# Patient Record
Sex: Female | Born: 1950 | Race: White | Hispanic: No | Marital: Married | Smoking: Former smoker
Health system: Southern US, Community
[De-identification: ages and names within clinical notes are randomized; demographics above are authoritative.]

---

## 2012-03-20 DIAGNOSIS — K5000 Crohn's disease of small intestine without complications: Secondary | ICD-10-CM | POA: Insufficient documentation

## 2012-03-20 DIAGNOSIS — D509 Iron deficiency anemia, unspecified: Secondary | ICD-10-CM | POA: Insufficient documentation

## 2012-03-20 DIAGNOSIS — Z7960 Long term (current) use of unspecified immunomodulators and immunosuppressants: Secondary | ICD-10-CM | POA: Insufficient documentation

## 2012-05-24 ENCOUNTER — Ambulatory Visit: Payer: Self-pay | Admitting: Family Medicine

## 2012-08-15 DIAGNOSIS — K56600 Partial intestinal obstruction, unspecified as to cause: Secondary | ICD-10-CM | POA: Insufficient documentation

## 2012-11-02 DIAGNOSIS — K9089 Other intestinal malabsorption: Secondary | ICD-10-CM | POA: Insufficient documentation

## 2013-10-18 ENCOUNTER — Ambulatory Visit: Payer: Self-pay | Admitting: Family Medicine

## 2014-12-16 DIAGNOSIS — G4700 Insomnia, unspecified: Secondary | ICD-10-CM | POA: Insufficient documentation

## 2015-06-18 ENCOUNTER — Other Ambulatory Visit: Payer: Self-pay | Admitting: Family Medicine

## 2015-06-18 DIAGNOSIS — Z1231 Encounter for screening mammogram for malignant neoplasm of breast: Secondary | ICD-10-CM

## 2015-06-19 ENCOUNTER — Ambulatory Visit
Admission: RE | Admit: 2015-06-19 | Discharge: 2015-06-19 | Disposition: A | Discharge status: Home / Self Care | Payer: BLUE CROSS/BLUE SHIELD | Source: Ambulatory Visit | Attending: Family Medicine | Admitting: Family Medicine

## 2015-06-19 DIAGNOSIS — Z1231 Encounter for screening mammogram for malignant neoplasm of breast: Secondary | ICD-10-CM | POA: Diagnosis not present

## 2016-05-21 ENCOUNTER — Other Ambulatory Visit: Payer: Self-pay | Admitting: Family Medicine

## 2016-05-21 DIAGNOSIS — Z1231 Encounter for screening mammogram for malignant neoplasm of breast: Secondary | ICD-10-CM

## 2016-06-21 ENCOUNTER — Ambulatory Visit
Admission: RE | Admit: 2016-06-21 | Discharge: 2016-06-21 | Disposition: A | Discharge status: Home / Self Care | Payer: Medicare HMO | Source: Ambulatory Visit | Attending: Family Medicine | Admitting: Family Medicine

## 2016-06-21 DIAGNOSIS — Z1231 Encounter for screening mammogram for malignant neoplasm of breast: Secondary | ICD-10-CM | POA: Insufficient documentation

## 2017-04-05 DIAGNOSIS — E781 Pure hyperglyceridemia: Secondary | ICD-10-CM | POA: Insufficient documentation

## 2017-05-24 ENCOUNTER — Other Ambulatory Visit: Payer: Self-pay | Admitting: Family Medicine

## 2017-05-24 DIAGNOSIS — Z1231 Encounter for screening mammogram for malignant neoplasm of breast: Secondary | ICD-10-CM

## 2017-06-23 ENCOUNTER — Ambulatory Visit
Admission: RE | Admit: 2017-06-23 | Discharge: 2017-06-23 | Disposition: A | Discharge status: Home / Self Care | Payer: Medicare HMO | Source: Ambulatory Visit | Attending: Family Medicine | Admitting: Family Medicine

## 2017-06-23 ENCOUNTER — Encounter (INDEPENDENT_AMBULATORY_CARE_PROVIDER_SITE_OTHER): Payer: Self-pay

## 2017-06-23 DIAGNOSIS — Z1231 Encounter for screening mammogram for malignant neoplasm of breast: Secondary | ICD-10-CM | POA: Diagnosis not present

## 2017-07-13 DIAGNOSIS — M47816 Spondylosis without myelopathy or radiculopathy, lumbar region: Secondary | ICD-10-CM | POA: Insufficient documentation

## 2017-07-13 DIAGNOSIS — G8929 Other chronic pain: Secondary | ICD-10-CM | POA: Insufficient documentation

## 2017-08-24 DIAGNOSIS — M7072 Other bursitis of hip, left hip: Secondary | ICD-10-CM | POA: Insufficient documentation

## 2017-09-06 DIAGNOSIS — M810 Age-related osteoporosis without current pathological fracture: Secondary | ICD-10-CM | POA: Insufficient documentation

## 2018-01-06 DIAGNOSIS — H4089 Other specified glaucoma: Secondary | ICD-10-CM | POA: Insufficient documentation

## 2018-04-01 DIAGNOSIS — R1319 Other dysphagia: Secondary | ICD-10-CM | POA: Insufficient documentation

## 2018-04-24 ENCOUNTER — Other Ambulatory Visit: Payer: Self-pay | Admitting: Family Medicine

## 2018-04-24 DIAGNOSIS — Z1231 Encounter for screening mammogram for malignant neoplasm of breast: Secondary | ICD-10-CM

## 2018-06-27 ENCOUNTER — Ambulatory Visit
Admission: RE | Admit: 2018-06-27 | Discharge: 2018-06-27 | Disposition: A | Discharge status: Home / Self Care | Payer: Medicare HMO | Source: Ambulatory Visit | Attending: Family Medicine | Admitting: Family Medicine

## 2018-06-27 DIAGNOSIS — Z1231 Encounter for screening mammogram for malignant neoplasm of breast: Secondary | ICD-10-CM

## 2018-07-12 DIAGNOSIS — G5702 Lesion of sciatic nerve, left lower limb: Secondary | ICD-10-CM | POA: Insufficient documentation

## 2019-02-08 DIAGNOSIS — R200 Anesthesia of skin: Secondary | ICD-10-CM | POA: Insufficient documentation

## 2019-02-08 DIAGNOSIS — R202 Paresthesia of skin: Secondary | ICD-10-CM | POA: Insufficient documentation

## 2019-03-09 DIAGNOSIS — R200 Anesthesia of skin: Secondary | ICD-10-CM | POA: Insufficient documentation

## 2019-03-09 DIAGNOSIS — M79671 Pain in right foot: Secondary | ICD-10-CM | POA: Insufficient documentation

## 2019-03-09 DIAGNOSIS — M436 Torticollis: Secondary | ICD-10-CM | POA: Insufficient documentation

## 2019-03-15 ENCOUNTER — Other Ambulatory Visit: Payer: Self-pay | Admitting: Neurology

## 2019-03-15 DIAGNOSIS — R200 Anesthesia of skin: Secondary | ICD-10-CM

## 2019-03-23 ENCOUNTER — Ambulatory Visit
Admission: RE | Admit: 2019-03-23 | Discharge: 2019-03-23 | Disposition: A | Discharge status: Home / Self Care | Payer: Medicare HMO | Source: Ambulatory Visit | Attending: Neurology | Admitting: Neurology

## 2019-03-23 ENCOUNTER — Other Ambulatory Visit: Payer: Self-pay

## 2019-03-23 DIAGNOSIS — R202 Paresthesia of skin: Secondary | ICD-10-CM | POA: Diagnosis present

## 2019-03-23 DIAGNOSIS — R200 Anesthesia of skin: Secondary | ICD-10-CM

## 2019-03-30 ENCOUNTER — Other Ambulatory Visit (INDEPENDENT_AMBULATORY_CARE_PROVIDER_SITE_OTHER): Payer: Self-pay | Admitting: Vascular Surgery

## 2019-03-30 DIAGNOSIS — R200 Anesthesia of skin: Secondary | ICD-10-CM

## 2019-04-09 ENCOUNTER — Ambulatory Visit (INDEPENDENT_AMBULATORY_CARE_PROVIDER_SITE_OTHER): Payer: Medicare HMO

## 2019-04-09 ENCOUNTER — Encounter (INDEPENDENT_AMBULATORY_CARE_PROVIDER_SITE_OTHER): Payer: Self-pay | Admitting: Vascular Surgery

## 2019-04-09 ENCOUNTER — Other Ambulatory Visit: Payer: Self-pay

## 2019-04-09 ENCOUNTER — Ambulatory Visit (INDEPENDENT_AMBULATORY_CARE_PROVIDER_SITE_OTHER): Payer: Medicare HMO | Admitting: Vascular Surgery

## 2019-04-09 VITALS — BP 122/72 | HR 69 | Resp 16 | Ht 66.0 in | Wt 148.0 lb

## 2019-04-09 DIAGNOSIS — G4730 Sleep apnea, unspecified: Secondary | ICD-10-CM | POA: Insufficient documentation

## 2019-04-09 DIAGNOSIS — R202 Paresthesia of skin: Secondary | ICD-10-CM

## 2019-04-09 DIAGNOSIS — R200 Anesthesia of skin: Secondary | ICD-10-CM

## 2019-04-09 DIAGNOSIS — G8929 Other chronic pain: Secondary | ICD-10-CM

## 2019-04-09 DIAGNOSIS — M5442 Lumbago with sciatica, left side: Secondary | ICD-10-CM

## 2019-04-09 DIAGNOSIS — E781 Pure hyperglyceridemia: Secondary | ICD-10-CM

## 2019-04-09 DIAGNOSIS — T7840XA Allergy, unspecified, initial encounter: Secondary | ICD-10-CM | POA: Insufficient documentation

## 2019-04-09 DIAGNOSIS — I10 Essential (primary) hypertension: Secondary | ICD-10-CM | POA: Diagnosis not present

## 2019-04-09 NOTE — Progress Notes (Signed)
MRN : [id number]  Kaitlin Fisher is a 68 y.o. (02/15/1951) female who presents with chief complaint of  Chief Complaint  Patient presents with   New Patient (Initial Visit)    LE numbness  .  History of Present Illness:   The patient is seen for evaluation of painful lower extremities. Patient notes the pain is variable and not always associated with activity.  The pain is somewhat consistent day to day occurring on most days. The patient notes the pain also occurs with standing and routinely seems worse as the day wears on. The pain has been progressive over the past several years. The patient states these symptoms are causing  a profound negative impact on quality of life and daily activities.  The patient denies rest pain or dangling of an extremity off the side of the bed during the night for relief. No open wounds or sores at this time. No history of DVT or phlebitis. No prior interventions or surgeries.  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   ABI's are normal bilaterally   Current Meds  Medication Sig   alendronate (FOSAMAX) 70 MG tablet Take by mouth.   amitriptyline (ELAVIL) 25 MG tablet Take by mouth.   amLODipine (NORVASC) 10 MG tablet Take by mouth.   Cholecalciferol (VITAMIN D-1000 MAX ST) 25 MCG (1000 UT) tablet Take by mouth.   colestipol (COLESTID) 1 g tablet Take by mouth.   gabapentin (NEURONTIN) 100 MG capsule Tale 100 mg three times a day   latanoprost (XALATAN) 0.005 % ophthalmic solution INSTILL 1 DROP INTO EACH EYE IN THE EVENING FOR 30 DAYS   potassium chloride SA (KLOR-CON) 20 MEQ tablet Take by mouth.   ustekinumab (STELARA) 90 MG/ML SOSY injection Inject into the skin.    History reviewed. No pertinent past medical history.  History reviewed. No pertinent surgical history.  Social History Social History   Tobacco Use   Smoking status: Former Smoker   Smokeless tobacco: Never Used  Substance  Use Topics   Alcohol use: Not on file   Drug use: Not on file    Family History Family History  Problem Relation Age of Onset   Breast cancer Neg Hx   No family history of bleeding/clotting disorders, porphyria or autoimmune disease   Allergies  Allergen Reactions   Oxycodone-Acetaminophen Nausea Only     REVIEW OF SYSTEMS (Negative unless checked)  Constitutional: [] Weight loss  [] Fever  [] Chills Cardiac: [] Chest pain   [] Chest pressure   [] Palpitations   [] Shortness of breath when laying flat   [] Shortness of breath with exertion. Vascular:  [x] Pain in legs with walking   [x] Pain in legs at rest  [] History of DVT   [] Phlebitis   [] Swelling in legs   [] Varicose veins   [] Non-healing ulcers Pulmonary:   [] Uses home oxygen   [] Productive cough   [] Hemoptysis   [] Wheeze  [] COPD   [] Asthma Neurologic:  [] Dizziness   [] Seizures   [] History of stroke   [] History of TIA  [] Aphasia   [] Vissual changes   [] Weakness or numbness in arm   [] Weakness or numbness in leg Musculoskeletal:   [] Joint swelling   [] Joint pain   [] Low back pain Hematologic:  [] Easy bruising  [] Easy bleeding   [] Hypercoagulable state   [] Anemic Gastrointestinal:  [] Diarrhea   [] Vomiting  [] Gastroesophageal reflux/heartburn   [] Difficulty swallowing. Genitourinary:  [] Chronic kidney disease   [] Difficult urination  [] Frequent urination   []   Blood in urine Skin:  [] Rashes   [] Ulcers  Psychological:  [] History of anxiety   []  History of major depression.  Physical Examination  Vitals:   04/09/19 1532  BP: 122/72  Pulse: 69  Resp: 16  Weight: 148 lb (67.1 kg)  Height: 5\' 6"  (1.676 m)   Body mass index is 23.89 kg/m. Gen: WD/WN, NAD Head: [place]/AT, No temporalis wasting.  Ear/Nose/Throat: Hearing grossly intact, nares w/o erythema or drainage, poor dentition Eyes: PER, EOMI, sclera nonicteric.  Neck: Supple, no masses.  No bruit or JVD.  Pulmonary:  Good air movement, clear to auscultation bilaterally, no use  of accessory muscles.  Cardiac: RRR, normal S1, S2, no Murmurs. Vascular:  Vessel Right Left  Radial Palpable Palpable  PT Palpable Palpable  DP Palpable Palpable  Gastrointestinal: soft, non-distended. No guarding/no peritoneal signs.  Musculoskeletal: M/S 5/5 throughout.  No deformity or atrophy.  Neurologic: CN 2-12 intact. Pain and light touch intact in extremities.  Symmetrical.  Speech is fluent. Motor exam as listed above. Psychiatric: Judgment intact, Mood & affect appropriate for pt's clinical situation. Dermatologic: No rashes or ulcers noted.  No changes consistent with cellulitis. Lymph : No Cervical lymphadenopathy, no lichenification or skin changes of chronic lymphedema.  CBC No results found for: WBC, HGB, HCT, MCV, PLT  BMET No results found for: NA, K, CL, CO2, GLUCOSE, BUN, CREATININE, CALCIUM, GFRNONAA, GFRAA CrCl cannot be calculated (No successful lab value found.).  COAG No results found for: INR, PROTIME  Radiology Mr Lumbar Spine Wo Contrast  Result Date: 03/24/2019 CLINICAL DATA:  Left foot numbness. Tingling in both feet EXAM: MRI LUMBAR SPINE WITHOUT CONTRAST TECHNIQUE: Multiplanar, multisequence MR imaging of the lumbar spine was performed. No intravenous contrast was administered. COMPARISON:  None. FINDINGS: Segmentation:  Normal Alignment:  Grade 1 anterolisthesis at L4-5 Vertebrae:  No fracture, evidence of discitis, or bone lesion. Conus medullaris and cauda equina: Conus extends to the T12 level. Conus and cauda equina appear normal. Paraspinal and other soft tissues: Negative. Disc levels: T11-12: Normal disc. No stenosis. T12-L1: Normal disc space and facets. No spinal canal or neuroforaminal stenosis. L1-L2: Disc desiccation with mild bulge. No spinal canal or neural foraminal stenosis. Normal facets. L2-L3: Mild disc bulge without spinal canal stenosis. Normal facets. No neural foraminal stenosis. L3-L4: Mild disc bulge without spinal canal stenosis.  Mild facet hypertrophy. There is slight narrowing of the left subarticular recess. No neural foraminal stenosis. L4-L5: Grade 1 anterolisthesis due to severe facet hypertrophy. Moderate spinal canal stenosis. No neural foraminal stenosis. The disc is partially uncovered. L5-S1: Normal disc space and facets. No spinal canal or neuroforaminal stenosis. Visualized sacrum: Normal. IMPRESSION: 1. Grade 1 anterolisthesis and moderate spinal canal stenosis at L4-L5 with severe facet arthrosis. 2. Mild left subarticular recess narrowing at L3-L4. Electronically Signed   By: M.D.   On: 03/24/2019 02:13   Abi  Result Date: 04/09/2019 LOWER EXTREMITY DOPPLER STUDY Indications: Numbness in toes.  Performing Technologist: RT (R)(VS)  Examination Guidelines: A complete evaluation includes at minimum, Doppler waveform signals and systolic blood pressure reading at the level of bilateral brachial, anterior tibial, and posterior tibial arteries, when vessel segments are accessible. Bilateral testing is considered an integral part of a complete examination. Photoelectric Plethysmograph (PPG) waveforms and toe systolic pressure readings are included as required and additional duplex testing as needed. Limited examinations for reoccurring indications may be performed as noted.  ABI Findings: +---------+------------------+-----+---------+--------+  Right  Rt Pressure (mmHg) Index Waveform  Comment   +---------+------------------+-----+---------+--------+  Brachial  143                                          +---------+------------------+-----+---------+--------+  ATA       147                1.03  triphasic           +---------+------------------+-----+---------+--------+  PTA       170                1.19  triphasic           +---------+------------------+-----+---------+--------+  Great Toe 100                0.70  Abnormal            +---------+------------------+-----+---------+--------+  +---------+------------------+-----+---------+-------+  Left      Lt Pressure (mmHg) Index Waveform  Comment  +---------+------------------+-----+---------+-------+  Brachial  123                                         +---------+------------------+-----+---------+-------+  ATA       170                1.19  triphasic          +---------+------------------+-----+---------+-------+  PTA       157                1.10  triphasic          +---------+------------------+-----+---------+-------+  Great Toe 102                0.71  Normal             +---------+------------------+-----+---------+-------+ Summary: Right: Resting right ankle-brachial index is within normal range. No evidence of significant right lower extremity arterial disease. The right toe-brachial index is normal. Left: Resting left ankle-brachial index is within normal range. No evidence of significant left lower extremity arterial disease. The left toe-brachial index is normal. *See table(s) above for measurements and observations.  Electronically signed by Levora Dredge MD on 04/09/2019 at 4:58:48 PM.   Final      Assessment/Plan 1. Numbness and tingling of both feet Recommend:  I do not find evidence of Vascular pathology that would explain the patient's symptoms  The patient has atypical pain symptoms for vascular disease  I do not find evidence of Vascular pathology that would explain the patient's symptoms and I suspect the patient is c/o pseudoclaudication.  Patient should have an evaluation of his LS spine which I defer to the primary service.  Noninvasive studies including venous ultrasound of the legs do not identify vascular problems  The patient should continue walking and begin a more formal exercise program. The patient should continue his antiplatelet therapy and aggressive treatment of the lipid abnormalities. The patient should begin wearing graduated compression socks 15-20 mmHg strength to control her mild  edema.  Patient will follow-up with me on a PRN basis  Further work-up of her lower extremity pain is deferred to the primary service     2. Chronic bilateral low back pain with left-sided sciatica Recommend:  I do not find evidence of Vascular pathology that would explain the patient's symptoms  The patient has atypical pain symptoms for vascular disease  I do not find evidence of Vascular pathology that would explain the patient's symptoms and I suspect the patient is c/o pseudoclaudication.  Patient should have an evaluation of his LS spine which I defer to the primary service.  Noninvasive studies including venous ultrasound of the legs do not identify vascular problems  The patient should continue walking and begin a more formal exercise program. The patient should continue his antiplatelet therapy and aggressive treatment of the lipid abnormalities. The patient should begin wearing graduated compression socks 15-20 mmHg strength to control her mild edema.  Patient will follow-up with me on a PRN basis  Further work-up of her lower extremity pain is deferred to the primary service     3. Essential hypertension Continue antihypertensive medications as already ordered, these medications have been reviewed and there are no changes at this time.   4. Hypertriglyceridemia Continue statin as ordered and reviewed, no changes at this time    Hortencia Pilar, MD  04/09/2019 5:20 PM

## 2019-04-27 ENCOUNTER — Other Ambulatory Visit: Payer: Self-pay | Admitting: Family Medicine

## 2019-04-27 DIAGNOSIS — Z1231 Encounter for screening mammogram for malignant neoplasm of breast: Secondary | ICD-10-CM

## 2019-07-02 ENCOUNTER — Ambulatory Visit
Admission: RE | Admit: 2019-07-02 | Discharge: 2019-07-02 | Disposition: A | Discharge status: Home / Self Care | Payer: Medicare HMO | Source: Ambulatory Visit | Attending: Family Medicine | Admitting: Family Medicine

## 2019-07-02 ENCOUNTER — Other Ambulatory Visit: Payer: Self-pay

## 2019-07-02 DIAGNOSIS — Z1231 Encounter for screening mammogram for malignant neoplasm of breast: Secondary | ICD-10-CM | POA: Diagnosis not present

## 2020-05-19 ENCOUNTER — Other Ambulatory Visit: Payer: Self-pay

## 2020-05-19 DIAGNOSIS — Z1231 Encounter for screening mammogram for malignant neoplasm of breast: Secondary | ICD-10-CM

## 2020-07-02 ENCOUNTER — Ambulatory Visit
Admission: RE | Admit: 2020-07-02 | Discharge: 2020-07-02 | Disposition: A | Discharge status: Home / Self Care | Payer: Medicare HMO | Source: Ambulatory Visit

## 2020-07-02 ENCOUNTER — Other Ambulatory Visit: Payer: Self-pay

## 2020-07-02 DIAGNOSIS — Z1231 Encounter for screening mammogram for malignant neoplasm of breast: Secondary | ICD-10-CM | POA: Insufficient documentation

## 2020-11-29 IMAGING — MG DIGITAL SCREENING BILAT W/ TOMO W/ CAD
8 series · 8 of 24 positions shown · non-contrast
Comparison: Previous exam(s).

CLINICAL DATA: Screening.

EXAM:
DIGITAL SCREENING BILATERAL MAMMOGRAM WITH TOMO AND CAD

[R MLO synth-2D]
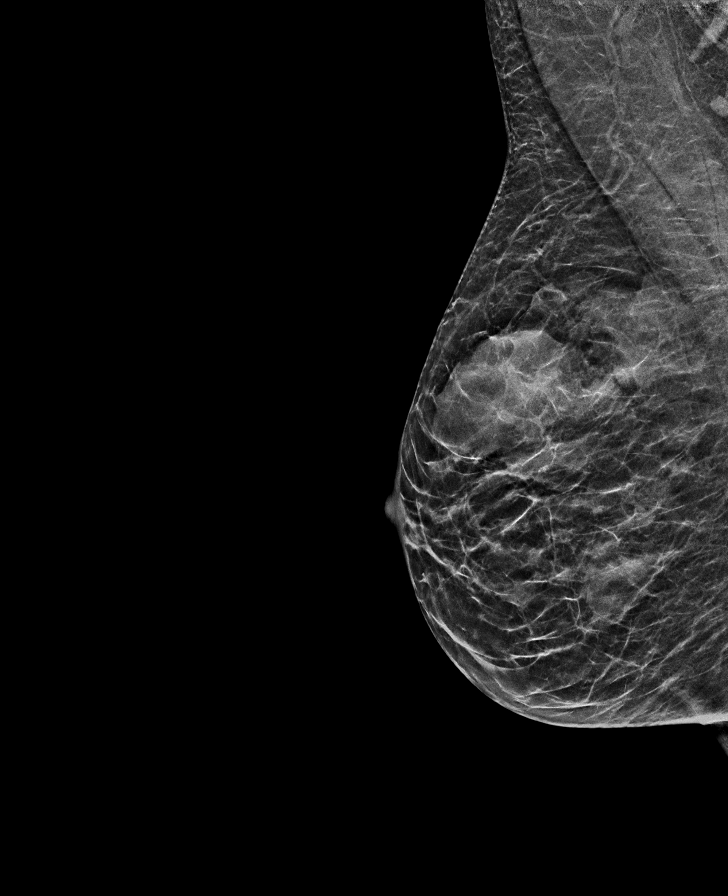

[L MLO synth-2D]
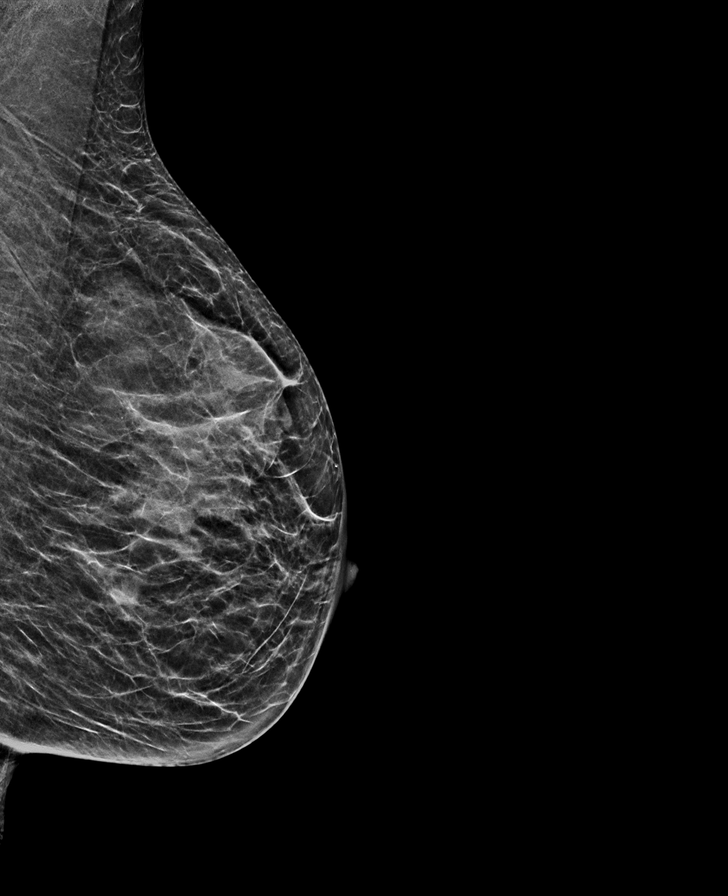

[R CC synth-2D]
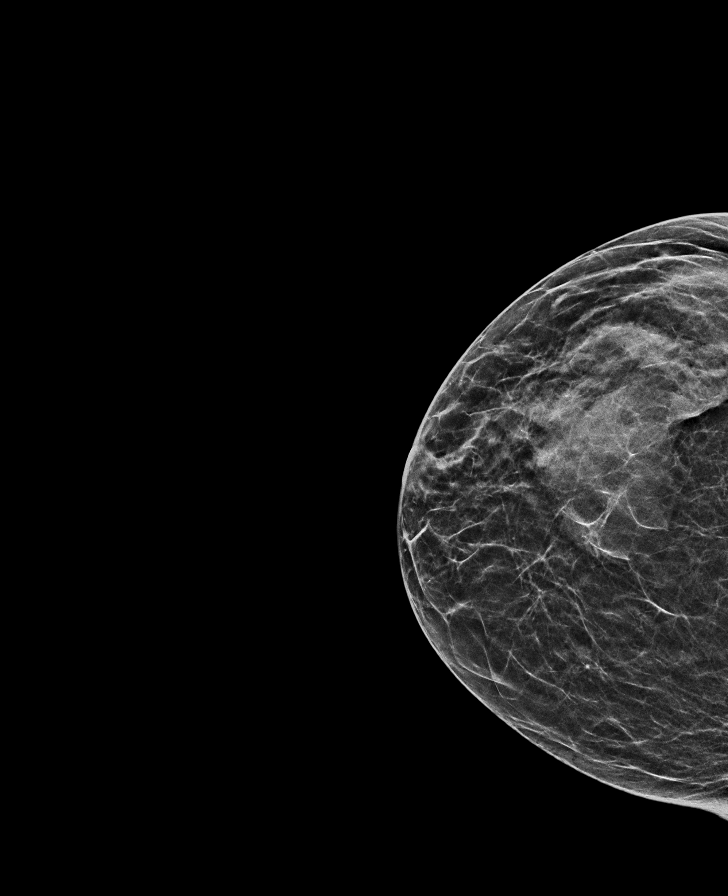

[L CC synth-2D]
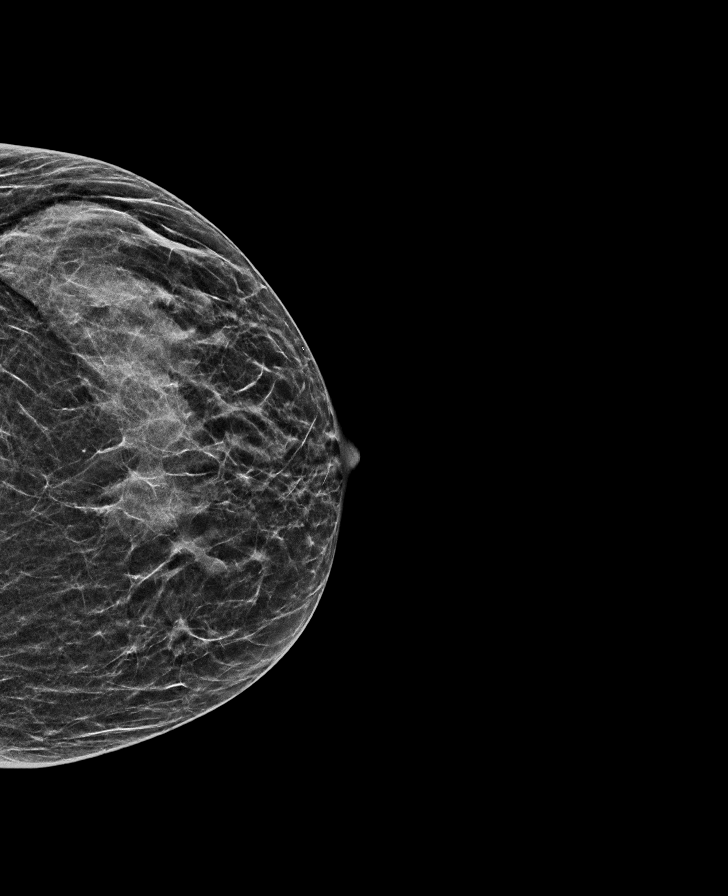

[R CC tomo · tomo slice 21/40.0]
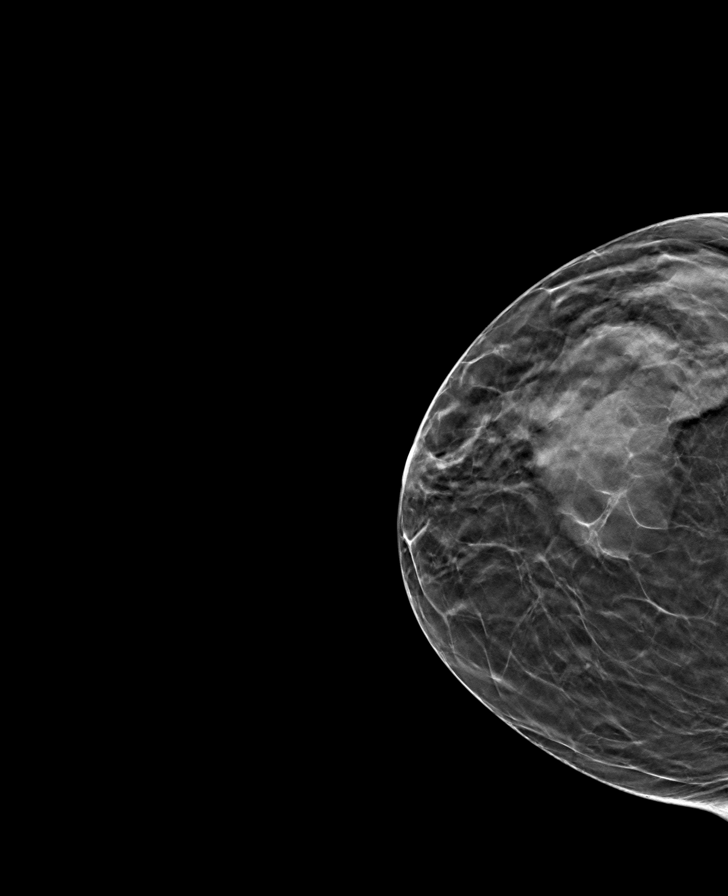

[L MLO tomo · tomo slice 22/43.0]
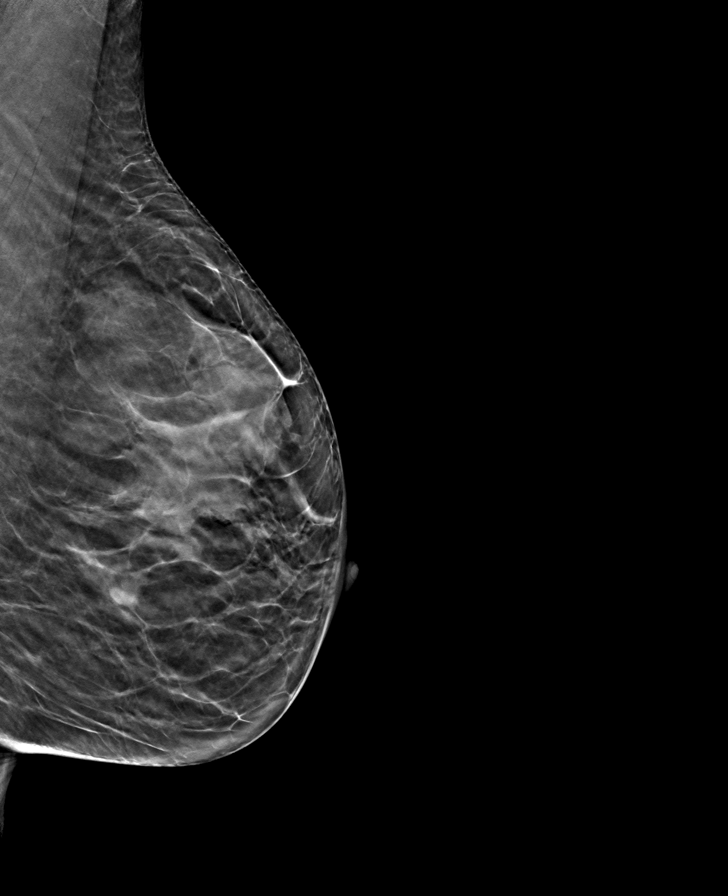

[L CC tomo · tomo slice 21/40.0]
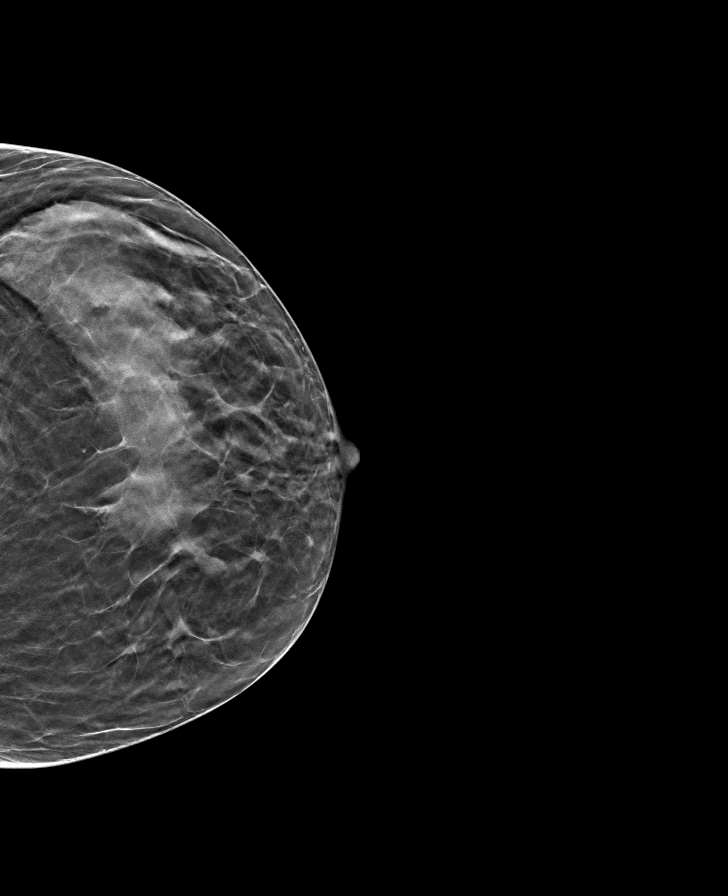

[R MLO tomo · tomo slice 22/43.0]
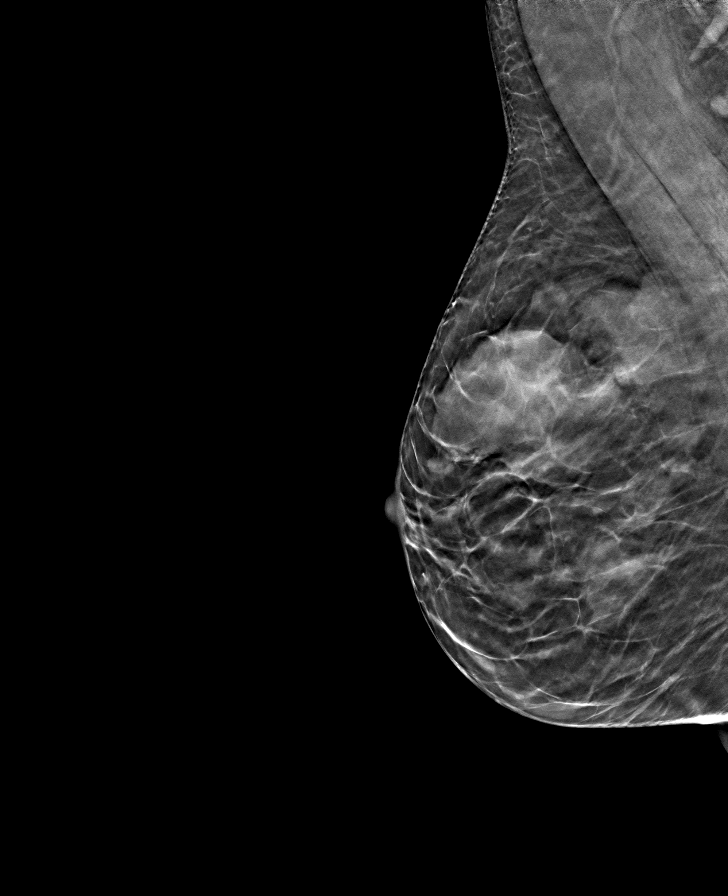

[8 of 24 positions shown; findings below may reference images not displayed]

ACR Breast Density Category c: The breast tissue is heterogeneously
dense, which may obscure small masses.
FINDINGS: There are no findings suspicious for malignancy. Images were
processed with CAD.
IMPRESSION: No mammographic evidence of malignancy. A result letter of this
screening mammogram will be mailed directly to the patient.

RECOMMENDATION:
Screening mammogram in one year. (Code:[id number]-U-LHB)

BI-RADS CATEGORY  1: Negative.

## 2021-03-30 ENCOUNTER — Other Ambulatory Visit: Payer: Self-pay

## 2021-03-30 DIAGNOSIS — Z1231 Encounter for screening mammogram for malignant neoplasm of breast: Secondary | ICD-10-CM

## 2021-07-06 ENCOUNTER — Other Ambulatory Visit: Payer: Self-pay

## 2021-07-06 ENCOUNTER — Ambulatory Visit
Admission: RE | Admit: 2021-07-06 | Discharge: 2021-07-06 | Disposition: A | Discharge status: Home / Self Care | Payer: Medicare HMO | Source: Ambulatory Visit

## 2021-07-06 DIAGNOSIS — Z1231 Encounter for screening mammogram for malignant neoplasm of breast: Secondary | ICD-10-CM | POA: Insufficient documentation

## 2022-03-02 ENCOUNTER — Other Ambulatory Visit: Payer: Self-pay | Admitting: Physician Assistant

## 2022-03-02 DIAGNOSIS — Z1231 Encounter for screening mammogram for malignant neoplasm of breast: Secondary | ICD-10-CM

## 2022-03-08 ENCOUNTER — Encounter (INDEPENDENT_AMBULATORY_CARE_PROVIDER_SITE_OTHER): Payer: Self-pay

## 2022-07-07 ENCOUNTER — Ambulatory Visit
Admission: RE | Admit: 2022-07-07 | Discharge: 2022-07-07 | Disposition: A | Discharge status: Home / Self Care | Payer: Medicare HMO | Source: Ambulatory Visit | Attending: Physician Assistant | Admitting: Physician Assistant

## 2022-07-07 DIAGNOSIS — Z1231 Encounter for screening mammogram for malignant neoplasm of breast: Secondary | ICD-10-CM
